# Patient Record
Sex: Male | Born: 1940 | Race: White | Hispanic: No | Smoking: Former smoker
Health system: Southern US, Community
[De-identification: ages and names within clinical notes are randomized; demographics above are authoritative.]

## PROBLEM LIST (undated history)

## (undated) DIAGNOSIS — N200 Calculus of kidney: Secondary | ICD-10-CM

## (undated) DIAGNOSIS — M7021 Olecranon bursitis, right elbow: Secondary | ICD-10-CM

## (undated) DIAGNOSIS — K219 Gastro-esophageal reflux disease without esophagitis: Secondary | ICD-10-CM

## (undated) DIAGNOSIS — N433 Hydrocele, unspecified: Secondary | ICD-10-CM

## (undated) DIAGNOSIS — E785 Hyperlipidemia, unspecified: Secondary | ICD-10-CM

## (undated) DIAGNOSIS — M069 Rheumatoid arthritis, unspecified: Secondary | ICD-10-CM

## (undated) DIAGNOSIS — I639 Cerebral infarction, unspecified: Secondary | ICD-10-CM

## (undated) DIAGNOSIS — R7301 Impaired fasting glucose: Secondary | ICD-10-CM

## (undated) DIAGNOSIS — Z7901 Long term (current) use of anticoagulants: Secondary | ICD-10-CM

## (undated) DIAGNOSIS — G8929 Other chronic pain: Secondary | ICD-10-CM

## (undated) DIAGNOSIS — I10 Essential (primary) hypertension: Secondary | ICD-10-CM

## (undated) DIAGNOSIS — R001 Bradycardia, unspecified: Secondary | ICD-10-CM

## (undated) DIAGNOSIS — I429 Cardiomyopathy, unspecified: Secondary | ICD-10-CM

## (undated) DIAGNOSIS — I25119 Atherosclerotic heart disease of native coronary artery with unspecified angina pectoris: Secondary | ICD-10-CM

## (undated) DIAGNOSIS — R55 Syncope and collapse: Secondary | ICD-10-CM

## (undated) DIAGNOSIS — M549 Dorsalgia, unspecified: Secondary | ICD-10-CM

## (undated) DIAGNOSIS — J189 Pneumonia, unspecified organism: Secondary | ICD-10-CM

## (undated) DIAGNOSIS — K257 Chronic gastric ulcer without hemorrhage or perforation: Secondary | ICD-10-CM

## (undated) DIAGNOSIS — M25511 Pain in right shoulder: Secondary | ICD-10-CM

## (undated) DIAGNOSIS — I480 Paroxysmal atrial fibrillation: Secondary | ICD-10-CM

## (undated) DIAGNOSIS — M25512 Pain in left shoulder: Secondary | ICD-10-CM

## (undated) DIAGNOSIS — M79604 Pain in right leg: Secondary | ICD-10-CM

## (undated) HISTORY — DX: Chronic gastric ulcer without hemorrhage or perforation: K25.7

## (undated) HISTORY — DX: Pain in right leg: M79.604

## (undated) HISTORY — DX: Atherosclerotic heart disease of native coronary artery with unspecified angina pectoris: I25.119

## (undated) HISTORY — DX: Essential (primary) hypertension: I10

## (undated) HISTORY — DX: Other chronic pain: G89.29

## (undated) HISTORY — DX: Long term (current) use of anticoagulants: Z79.01

## (undated) HISTORY — PX: CORONARY ANGIOPLASTY WITH STENT PLACEMENT: SHX49

## (undated) HISTORY — DX: Hyperlipidemia, unspecified: E78.5

## (undated) HISTORY — DX: Impaired fasting glucose: R73.01

## (undated) HISTORY — DX: Pneumonia, unspecified organism: J18.9

## (undated) HISTORY — DX: Olecranon bursitis, right elbow: M70.21

## (undated) HISTORY — DX: Dorsalgia, unspecified: M54.9

## (undated) HISTORY — DX: Bradycardia, unspecified: R00.1

## (undated) HISTORY — DX: Pain in left shoulder: M25.512

## (undated) HISTORY — DX: Cardiomyopathy, unspecified: I42.9

## (undated) HISTORY — DX: Hydrocele, unspecified: N43.3

## (undated) HISTORY — DX: Gastro-esophageal reflux disease without esophagitis: K21.9

## (undated) HISTORY — DX: Cerebral infarction, unspecified: I63.9

## (undated) HISTORY — DX: Paroxysmal atrial fibrillation: I48.0

## (undated) HISTORY — DX: Calculus of kidney: N20.0

## (undated) HISTORY — DX: Syncope and collapse: R55

## (undated) HISTORY — PX: TOTAL HIP REVISION: SHX763

## (undated) HISTORY — DX: Rheumatoid arthritis, unspecified: M06.9

## (undated) HISTORY — DX: Pain in right shoulder: M25.511

---

## 2005-12-22 ENCOUNTER — Encounter: Admission: RE | Admit: 2005-12-22 | Discharge: 2005-12-22 | Payer: Self-pay | Admitting: Geriatric Medicine

## 2006-01-31 HISTORY — PX: CORONARY ARTERY BYPASS GRAFT: SHX141

## 2006-07-16 ENCOUNTER — Encounter: Admission: RE | Admit: 2006-07-16 | Discharge: 2006-07-16 | Payer: Self-pay | Admitting: Family Medicine

## 2006-07-30 ENCOUNTER — Encounter: Admission: RE | Admit: 2006-07-30 | Discharge: 2006-07-30 | Payer: Self-pay | Admitting: Family Medicine

## 2006-08-20 ENCOUNTER — Encounter: Admission: RE | Admit: 2006-08-20 | Discharge: 2006-08-20 | Payer: Self-pay | Admitting: Family Medicine

## 2012-06-10 ENCOUNTER — Telehealth: Payer: Self-pay | Admitting: *Deleted

## 2012-06-10 NOTE — Telephone Encounter (Signed)
Call and check on pt tomorrow.

## 2012-06-11 NOTE — Telephone Encounter (Signed)
Wrong pt

## 2015-03-28 DIAGNOSIS — E785 Hyperlipidemia, unspecified: Secondary | ICD-10-CM

## 2015-03-28 DIAGNOSIS — I482 Chronic atrial fibrillation, unspecified: Secondary | ICD-10-CM | POA: Insufficient documentation

## 2015-03-28 DIAGNOSIS — I25119 Atherosclerotic heart disease of native coronary artery with unspecified angina pectoris: Secondary | ICD-10-CM

## 2015-03-28 DIAGNOSIS — I480 Paroxysmal atrial fibrillation: Secondary | ICD-10-CM

## 2015-03-28 DIAGNOSIS — I119 Hypertensive heart disease without heart failure: Secondary | ICD-10-CM | POA: Insufficient documentation

## 2015-03-28 DIAGNOSIS — I10 Essential (primary) hypertension: Secondary | ICD-10-CM

## 2015-03-28 DIAGNOSIS — Z7901 Long term (current) use of anticoagulants: Secondary | ICD-10-CM

## 2015-03-28 HISTORY — DX: Essential (primary) hypertension: I10

## 2015-03-28 HISTORY — DX: Paroxysmal atrial fibrillation: I48.0

## 2015-03-28 HISTORY — DX: Long term (current) use of anticoagulants: Z79.01

## 2015-03-28 HISTORY — DX: Hyperlipidemia, unspecified: E78.5

## 2015-03-28 HISTORY — DX: Atherosclerotic heart disease of native coronary artery with unspecified angina pectoris: I25.119

## 2015-05-08 DIAGNOSIS — I639 Cerebral infarction, unspecified: Secondary | ICD-10-CM

## 2015-05-08 HISTORY — DX: Cerebral infarction, unspecified: I63.9

## 2016-02-27 DIAGNOSIS — J189 Pneumonia, unspecified organism: Secondary | ICD-10-CM

## 2016-02-27 DIAGNOSIS — R55 Syncope and collapse: Secondary | ICD-10-CM

## 2016-02-27 DIAGNOSIS — M549 Dorsalgia, unspecified: Secondary | ICD-10-CM

## 2016-02-27 HISTORY — DX: Syncope and collapse: R55

## 2016-02-27 HISTORY — DX: Pneumonia, unspecified organism: J18.9

## 2016-02-27 HISTORY — DX: Dorsalgia, unspecified: M54.9

## 2016-04-09 DIAGNOSIS — M25512 Pain in left shoulder: Secondary | ICD-10-CM

## 2016-04-09 DIAGNOSIS — G8929 Other chronic pain: Secondary | ICD-10-CM | POA: Insufficient documentation

## 2016-04-09 DIAGNOSIS — M25511 Pain in right shoulder: Secondary | ICD-10-CM

## 2016-04-09 HISTORY — DX: Other chronic pain: G89.29

## 2017-04-07 DIAGNOSIS — M79604 Pain in right leg: Secondary | ICD-10-CM

## 2017-04-07 DIAGNOSIS — N433 Hydrocele, unspecified: Secondary | ICD-10-CM

## 2017-04-07 HISTORY — DX: Pain in right leg: M79.604

## 2017-04-07 HISTORY — DX: Hydrocele, unspecified: N43.3

## 2017-04-10 DIAGNOSIS — M7021 Olecranon bursitis, right elbow: Secondary | ICD-10-CM

## 2017-04-10 HISTORY — DX: Olecranon bursitis, right elbow: M70.21

## 2017-08-28 DIAGNOSIS — K257 Chronic gastric ulcer without hemorrhage or perforation: Secondary | ICD-10-CM | POA: Insufficient documentation

## 2017-08-28 DIAGNOSIS — R7301 Impaired fasting glucose: Secondary | ICD-10-CM

## 2017-08-28 DIAGNOSIS — I429 Cardiomyopathy, unspecified: Secondary | ICD-10-CM

## 2017-08-28 DIAGNOSIS — M069 Rheumatoid arthritis, unspecified: Secondary | ICD-10-CM

## 2017-08-28 DIAGNOSIS — R001 Bradycardia, unspecified: Secondary | ICD-10-CM

## 2017-08-28 DIAGNOSIS — N200 Calculus of kidney: Secondary | ICD-10-CM

## 2017-08-28 DIAGNOSIS — K219 Gastro-esophageal reflux disease without esophagitis: Secondary | ICD-10-CM

## 2017-08-28 HISTORY — DX: Cardiomyopathy, unspecified: I42.9

## 2017-08-28 HISTORY — DX: Gastro-esophageal reflux disease without esophagitis: K21.9

## 2017-08-28 HISTORY — DX: Chronic gastric ulcer without hemorrhage or perforation: K25.7

## 2017-08-28 HISTORY — DX: Calculus of kidney: N20.0

## 2017-08-28 HISTORY — DX: Rheumatoid arthritis, unspecified: M06.9

## 2017-08-28 HISTORY — DX: Impaired fasting glucose: R73.01

## 2017-08-28 HISTORY — DX: Bradycardia, unspecified: R00.1

## 2017-09-03 ENCOUNTER — Ambulatory Visit (INDEPENDENT_AMBULATORY_CARE_PROVIDER_SITE_OTHER): Payer: Medicare Other | Admitting: Cardiology

## 2017-09-03 ENCOUNTER — Encounter: Payer: Self-pay | Admitting: Cardiology

## 2017-09-03 VITALS — BP 138/84 | HR 70 | Ht 74.0 in | Wt 204.0 lb

## 2017-09-03 DIAGNOSIS — E785 Hyperlipidemia, unspecified: Secondary | ICD-10-CM

## 2017-09-03 DIAGNOSIS — Z789 Other specified health status: Secondary | ICD-10-CM

## 2017-09-03 DIAGNOSIS — I10 Essential (primary) hypertension: Secondary | ICD-10-CM | POA: Diagnosis not present

## 2017-09-03 DIAGNOSIS — I480 Paroxysmal atrial fibrillation: Secondary | ICD-10-CM

## 2017-09-03 DIAGNOSIS — Z7901 Long term (current) use of anticoagulants: Secondary | ICD-10-CM | POA: Diagnosis not present

## 2017-09-03 DIAGNOSIS — I25119 Atherosclerotic heart disease of native coronary artery with unspecified angina pectoris: Secondary | ICD-10-CM | POA: Diagnosis not present

## 2017-09-03 DIAGNOSIS — R079 Chest pain, unspecified: Secondary | ICD-10-CM | POA: Diagnosis not present

## 2017-09-03 MED ORDER — NITROGLYCERIN 0.4 MG SL SUBL: 0.4000 mg | SUBLINGUAL_TABLET | SUBLINGUAL | 12 refills | Status: AC | PRN

## 2017-09-03 NOTE — Progress Notes (Signed)
Cardiology Office Note:    Date:  09/03/2017   ID:  Tristan Tate, DOB [birth year], MRN [id number]  PCP:  Leanora Ivanoff., MD  Cardiologist:  Norman Herrlich, MD    Referring MD: Dellinger, Romelle Starcher*    ASSESSMENT:    1. Coronary artery disease involving native coronary artery of native heart with angina pectoris (HCC)   2. Chronic atrial fibrillation (HCC)   3. Long term current use of anticoagulant   4. Essential hypertension   5. Hyperlipidemia, unspecified hyperlipidemia type   6. Statin intolerance    PLAN:    In order of problems listed above:  1. Worsened he is having symptoms that sound like limiting angina with the progression and will undergo myocardial perfusion study if abnormal will make a decision between intensified medical therapy or referral for PCI depending on high risk markers.  I did give him a prescription for nitroglycerin and asked him to continue current treatment 2. Paroxysmal atrial fibrillation stable continue warfarin 3. Stable continue warfarin goal INR is 2-2.5 4. Controlled continue current treatment 5. Poorly controlled intolerant of statins and unwilling to use PCSK9   Next appointment: One month   Medication Adjustments/Labs and Tests Ordered: Current medicines are reviewed at length with the patient today.  Concerns regarding medicines are outlined above.  Orders Placed This Encounter  Procedures  . MYOCARDIAL PERFUSION IMAGING  . EKG 12-Lead   Meds ordered this encounter  Medications  . nitroGLYCERIN (NITROSTAT) 0.4 MG SL tablet    Sig: Place 1 tablet (0.4 mg total) under the tongue every 5 (five) minutes as needed for chest pain.    Dispense:  25 tablet    Refill:  12    Chief Complaint  Patient presents with  . Follow-up    1 year follow up appt   . Chest Pain    I need a stress test  . Coronary Artery Disease  . Hypertension  . Atrial Fibrillation  . Anticoagulation    History of Present Illness:    Tristan Tate is a 77 y.o. male with a hx of CAD, Paroxysmal Atrial Fibrillation, Dyslipidemia with statin intolerance, S/P CABG in 2007, mild cardiomyopathy Ejection Fraction = 45-50% 02/27/16  last seen 08/11/16.  Diagnoses and all orders for 08/11/16  Coronary artery disease involving native coronary artery of native heart with angina pectoris (RAF-HCC) stable continue current medical treatment with warfarin beta blocker and start lipid-lowering therapy  Cardiomyopathy, secondary (RAF-HCC) stable mild no evidence of heart failure  Syncope, unspecified syncope type clinically appeared to be in the setting of pneumonia and macrolide antibiotic, with his CAD is at risk for both ventricular tachyarrhythmia as well as symptomatic bradycardia and I asked him where Holter monitor for 48 hours. - Holter monitor - 48 hour; Future  Hyperlipidemia, unspecified hyperlipidemia type (RAF-HCC) poorly controlled statin and Zetia might intolerant to start treatment and follow-up lipid profile in 6 weeks - alirocumab (PRALUENT PEN) 150 mg/mL subcutaneous injection; Inject 1 mL (150 mg total) under the skin every fourteen (14) days. - Lipid Panel; Future  Life line screen with normal ABI, mild L ICA stenosis, 145/36 cm/ sec  Compliance with diet, lifestyle and medications: Yes  We tried to treat his dyslipidemia PCSK9 but he stopped.  He said that recently he has had trouble doing physical activity walking outdoors playing golf because he is having typical exertional angina it interferes with his life.  He has not taken nitroglycerin.  He asked  me if I thought he needed a stress test I told him that was an option I thought that he should consider a coronary angiogram with PCI and stenting but he wants to start with having a stress test first and will set up a stress myocardial perfusion study to assess his symptoms degree of ischemia and guide whether we should consider repeat revascularization with PCI.  He has no  complaints of shortness of breath palpitation or syncope.  He had a [facility] screening and that day he was in atrial fibrillation he is in sinus rhythm today and continues with warfarin for stroke prophylaxis managed by his PCP.  He has had no bleeding complication. Past Medical History:  Diagnosis Date  . Back pain 02/27/2016  . Chronic gastric ulcer 08/28/2017  . Chronic pain of both shoulders 04/09/2016  . Coronary artery disease involving native coronary artery of native heart with angina pectoris (HCC) 03/28/2015  . Esophageal reflux 08/28/2017  . Essential hypertension 03/28/2015  . Hydrocele, right 04/07/2017  . Hyperlipemia 03/28/2015   Statin intolerant Statin intolerant  . Impaired fasting glucose 08/28/2017  . Long term current use of anticoagulant 03/28/2015  . Nephrolithiasis 08/28/2017  . Olecranon bursitis of right elbow 04/10/2017  . PAF (paroxysmal atrial fibrillation) (HCC) 03/28/2015   CHADS2 vasc score= 3  . Pneumonia 02/27/2016  . Rheumatoid arthritis (HCC) 08/28/2017  . Right leg pain 04/07/2017  . 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 Stroke (HCC) 05/08/2015  . Syncope 02/27/2016    Past Surgical History:  Procedure Laterality Date  . CORONARY ANGIOPLASTY WITH STENT PLACEMENT    . CORONARY ARTERY BYPASS GRAFT  01/2006   LTA to LAD svg's to D3, M3, Ramus and RCA   . TOTAL HIP REVISION      Current Medications: Current Meds  Medication Sig  . acetaminophen (TYLENOL 8 HOUR ARTHRITIS PAIN) 650 MG CR tablet Take 650 mg by mouth every 8 (eight) hours as needed for pain.  Marland Kitchen DOCOSAHEXAENOIC ACID PO Take 1 g by mouth 2 (two) times daily.  . metoprolol tartrate (LOPRESSOR) 50 MG tablet TK 1/2 T PO BID  . vitamin B-12 (CYANOCOBALAMIN) 1000 MCG tablet Take 1,000 mcg by mouth daily.  Marland Kitchen warfarin (COUMADIN) 4 MG tablet TAKE 1 TABLET BY MOUTH DAILY OR AS DIRECTED BASED ON INR     Allergies:   Aspirin and Nsaids   Social History    Socioeconomic History  . Marital status: Unknown    Spouse name: Not on file  . Number of children: Not on file  . Years of education: Not on file  . Highest education level: Not on file  Occupational History  . Not on file  Social Needs  . Financial resource strain: Not on file  . Food insecurity:    Worry: Not on file    Inability: Not on file  . Transportation needs:    Medical: Not on file    Non-medical: Not on file  Tobacco Use  . Smoking status: Former Smoker    Last attempt to quit: 03/29/1975    Years since quitting: 42.4  . Smokeless tobacco: Never Used  Substance and Sexual Activity  . Alcohol use: Never    Frequency: Never  . Drug use: Never  . Sexual activity: Not on file  Lifestyle  . Physical activity:    Days per week: Not on file    Minutes per session: Not on file  . Stress: Not on  file  Relationships  . Social connections:    Talks on phone: Not on file    Gets together: Not on file    Attends religious service: Not on file    Active member of club or organization: Not on file    Attends meetings of clubs or organizations: Not on file    Relationship status: Not on file  Other Topics Concern  . Not on file  Social History Narrative  . Not on file     Family History: The patient's family history includes Colon cancer in his brother. ROS:   Please see the history of present illness.    All other systems reviewed and are negative.  EKGs/Labs/Other Studies Reviewed:    The following studies were reviewed today:  EKG:  EKG  6 lead 08/12/17 Life Line AF controlled rate EKG todaySRTH old inf MI Recent Labs:   08/10/17 INR 3.0 No results found for requested labs within last 8760 hours.  Recent Lipid Panel  12/22/16 Chol 191, HDL 38 LDL 128 No results found for: CHOL, TRIG, HDL, CHOLHDL, VLDL, LDLCALC, LDLDIRECT  Physical Exam:    VS:  BP 138/84 (BP Location: Right Arm, Patient Position: Sitting, Cuff Size: Normal)   Pulse 70   Ht 6\' 2"   (1.88 m)   Wt 204 lb (92.5 kg)   SpO2 98%   BMI 26.19 kg/m     Wt Readings from Last 3 Encounters:  09/03/17 204 lb (92.5 kg)     GEN:  Well nourished, well developed in no acute distress HEENT: Normal NECK: No JVD; No carotid bruits LYMPHATICS: No lymphadenopathy CARDIAC: Irr Irr variable s1  RESPIRATORY:  Clear to auscultation without rales, wheezing or rhonchi  ABDOMEN: Soft, non-tender, non-distended MUSCULOSKELETAL:  No edema; No deformity  SKIN: Warm and dry NEUROLOGIC:  Alert and oriented x 3 PSYCHIATRIC:  Normal affect    Signed, Norman Herrlich, MD  09/03/2017 3:57 PM    [facility]

## 2017-09-03 NOTE — Patient Instructions (Addendum)
Medication Instructions:  Your physician has recommended you make the following change in your medication:  START nitroglycerin 0.4 mg sublingual (under your tongue) every 5 minutes as needed for chest pain. When having chest pain, stop what you are doing and sit down. Take 1 nitro, wait 5 minutes. Still having chest pain, take 1 nitro, wait 5 minutes. Still having chest pain, take 1 nitro, dial 911. Total of 3 nitro in 15 minutes.  Labwork: None  Testing/Procedures: You had an EKG today.  Your physician has requested that you have en exercise stress myoview. For further information please visit [email or web address] Please follow instruction sheet, as given.  Follow-Up: Your physician recommends that you schedule a follow-up appointment in: 4 weeks.  Any Other Special Instructions Will Be Listed Below (If Applicable).     If you need a refill on your cardiac medications before your next appointment, please call your pharmacy.

## 2017-09-07 ENCOUNTER — Telehealth (HOSPITAL_COMMUNITY): Payer: Self-pay | Admitting: *Deleted

## 2017-09-07 NOTE — Telephone Encounter (Signed)
Patient's wife, dpr, given detailed instructions per Myocardial Perfusion Study Information Sheet for the test on 09/09/17. Patient notified to arrive 15 minutes early and that it is imperative to arrive on time for appointment to keep from having the test rescheduled.  If you need to cancel or reschedule your appointment, please call the office within 24 hours of your appointment. . Patient verbalized understanding. Ricky Ala

## 2017-09-09 ENCOUNTER — Ambulatory Visit (HOSPITAL_COMMUNITY): Payer: Medicare Other | Attending: Cardiovascular Disease

## 2017-09-09 VITALS — Ht 74.0 in | Wt 204.0 lb

## 2017-09-09 DIAGNOSIS — I25119 Atherosclerotic heart disease of native coronary artery with unspecified angina pectoris: Secondary | ICD-10-CM | POA: Diagnosis present

## 2017-09-09 DIAGNOSIS — R55 Syncope and collapse: Secondary | ICD-10-CM

## 2017-09-09 DIAGNOSIS — I482 Chronic atrial fibrillation, unspecified: Secondary | ICD-10-CM

## 2017-09-09 LAB — MYOCARDIAL PERFUSION IMAGING
CHL CUP NUCLEAR SRS: 7
CHL CUP RESTING HR STRESS: 67 {beats}/min
CSEPPHR: 80 {beats}/min
LV dias vol: 127 mL (ref 62–150)
LV sys vol: 58 mL
RATE: 0.3
SDS: 5
SSS: 12
TID: 0.97

## 2017-09-09 MED ORDER — REGADENOSON 0.4 MG/5ML IV SOLN
0.4000 mg | Freq: Once | INTRAVENOUS | Status: AC
  Administered 2017-09-09: 0.4 mg via INTRAVENOUS

## 2017-09-09 MED ORDER — TECHNETIUM TC 99M TETROFOSMIN IV KIT
32.7000 | PACK | Freq: Once | INTRAVENOUS | Status: AC | PRN
  Administered 2017-09-09: 32.7 via INTRAVENOUS
  Filled 2017-09-09: qty 33

## 2017-09-09 MED ORDER — TECHNETIUM TC 99M TETROFOSMIN IV KIT
10.5000 | PACK | Freq: Once | INTRAVENOUS | Status: AC | PRN
  Administered 2017-09-09: 10.5 via INTRAVENOUS
  Filled 2017-09-09: qty 11

## 2017-10-12 NOTE — Progress Notes (Signed)
Cardiology Office Note:    Date:  10/13/2017   ID:  Tristan Tate, DOB 1941/02/14, MRN [id number]  PCP:  Leanora Ivanoff., MD  Cardiologist:  Norman Herrlich, MD    Referring MD: Dellinger, Romelle Starcher*    ASSESSMENT:    1. Coronary artery disease involving native coronary artery of native heart with angina pectoris (HCC)   2. Chronic atrial fibrillation (HCC)   3. Hypertensive heart disease without heart failure   4. Long term current use of anticoagulant    PLAN:    In order of problems listed above:  1. Stable continue current medical treatment at this time I would not refer for coronary angiography.  I did tell him that he can use nitroglycerin prior to strenuous activity and cold weather such as golf. 2. Stable rate controlled continue beta-blocker and warfarin managed by his PCP 3. Stable continue current treatment 4. Continue warfarin goal INR 2.5 managed by his PCP   Next appointment: 6 months   Medication Adjustments/Labs and Tests Ordered: Current medicines are reviewed at length with the patient today.  Concerns regarding medicines are outlined above.  No orders of the defined types were placed in this encounter.  No orders of the defined types were placed in this encounter.   Chief Complaint  Patient presents with  . Follow-up    after myoview stress test   . Coronary Artery Disease    History of Present Illness:    Tristan Tate is a 77 y.o. male with a hx of CAD, Paroxysmal Atrial Fibrillation, Dyslipidemia with statin intolerance, S/P CABG in 2007, mild cardiomyopathy Ejection Fraction = 45-50% last seen 09/03/17.  ASSESSMENT:    09/03/17   1. Coronary artery disease involving native coronary artery of native heart with angina pectoris (HCC)   2. Chronic atrial fibrillation (HCC)   3. Long term current use of anticoagulant   4. Essential hypertension   5. Hyperlipidemia, unspecified hyperlipidemia type   6. Statin intolerance    PLAN:      In order of problems listed above:  1.   Worsened he is having symptoms that sound like limiting angina with the progression and will undergo myocardial perfusion study if abnormal will make a decision between intensified medical therapy or referral for PCI depending on high risk markers.  I did give him a prescription for nitroglycerin and asked him to continue current treatment 5. Paroxysmal atrial fibrillation stable continue warfarin 6. Stable continue warfarin goal INR is 2-2.5 7. Controlled continue current treatment 8. Poorly controlled intolerant of statins and unwilling to use PCSK9  Compliance with diet, lifestyle and medications: Yes  He is relieved by the results of his stress myocardial perfusion study and has had no further anginal discomfort golfing and warmer weather.  I did give him a prescription for nitroglycerin and told him that he can take a prior to golf and cold weather.  Continues on warfarin without bleeding complication or TIA no chest pain shortness of breath or palpitation Past Medical History:  Diagnosis Date  . Back pain 02/27/2016  . Chronic gastric ulcer 08/28/2017  . Chronic pain of both shoulders 04/09/2016  . Coronary artery disease involving native coronary artery of native heart with angina pectoris (HCC) 03/28/2015  . Esophageal reflux 08/28/2017  . Essential hypertension 03/28/2015  . Hydrocele, right 04/07/2017  . Hyperlipemia 03/28/2015   Statin intolerant Statin intolerant  . Impaired fasting glucose 08/28/2017  . Long term current use of anticoagulant 03/28/2015  .  Nephrolithiasis 08/28/2017  . Olecranon bursitis of right elbow 04/10/2017  . PAF (paroxysmal atrial fibrillation) (HCC) 03/28/2015   CHADS2 vasc score= 3  . Pneumonia 02/27/2016  . Rheumatoid arthritis (HCC) 08/28/2017  . Right leg pain 04/07/2017  . 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 Stroke (HCC) 05/08/2015  . Syncope 02/27/2016    Past  Surgical History:  Procedure Laterality Date  . CORONARY ANGIOPLASTY WITH STENT PLACEMENT    . CORONARY ARTERY BYPASS GRAFT  01/2006   LTA to LAD svg's to D3, M3, Ramus and RCA   . TOTAL HIP REVISION      Current Medications: Current Meds  Medication Sig  . acetaminophen (TYLENOL 8 HOUR ARTHRITIS PAIN) 650 MG CR tablet Take 650 mg by mouth every 8 (eight) hours as needed for pain.  Marland Kitchen DOCOSAHEXAENOIC ACID PO Take 1 g by mouth 2 (two) times daily.  . metoprolol tartrate (LOPRESSOR) 50 MG tablet TK 1/2 T PO BID  . nitroGLYCERIN (NITROSTAT) 0.4 MG SL tablet Place 1 tablet (0.4 mg total) under the tongue every 5 (five) minutes as needed for chest pain.  . vitamin B-12 (CYANOCOBALAMIN) 1000 MCG tablet Take 1,000 mcg by mouth daily.  Marland Kitchen warfarin (COUMADIN) 4 MG tablet TAKE 1 TABLET BY MOUTH DAILY OR AS DIRECTED BASED ON INR     Allergies:   Aspirin and Nsaids   Social History   Socioeconomic History  . Marital status: Unknown    Spouse name: Not on file  . Number of children: Not on file  . Years of education: Not on file  . Highest education level: Not on file  Occupational History  . Not on file  Social Needs  . Financial resource strain: Not on file  . Food insecurity:    Worry: Not on file    Inability: Not on file  . Transportation needs:    Medical: Not on file    Non-medical: Not on file  Tobacco Use  . Smoking status: Former Smoker    Last attempt to quit: 03/29/1975    Years since quitting: 42.5  . Smokeless tobacco: Never Used  Substance and Sexual Activity  . Alcohol use: Never    Frequency: Never  . Drug use: Never  . Sexual activity: Not on file  Lifestyle  . Physical activity:    Days per week: Not on file    Minutes per session: Not on file  . Stress: Not on file  Relationships  . Social connections:    Talks on phone: Not on file    Gets together: Not on file    Attends religious service: Not on file    Active member of club or organization: Not on  file    Attends meetings of clubs or organizations: Not on file    Relationship status: Not on file  Other Topics Concern  . Not on file  Social History Narrative  . Not on file     Family History: The patient's family history includes Colon cancer in his brother. ROS:   Please see the history of present illness.    All other systems reviewed and are negative.  EKGs/Labs/Other Studies Reviewed:    The following studies were reviewed today:  MPI: Study Highlights   Nuclear stress EF: 54%.  There was no ST segment deviation noted during stress.  Defect 1: There is a small defect of moderate severity present in the basal inferior and mid inferior location. Findings consistent  with either diaphragmatic attenuation or prior infarct. No ischemia noted.  The left ventricular ejection fraction is mildly decreased (45-54%).  This is a low risk study.   Recent Labs: No results found for requested labs within last 8760 hours.  Recent Lipid Panel No results found for: CHOL, TRIG, HDL, CHOLHDL, VLDL, LDLCALC, LDLDIRECT  Physical Exam:    VS:  BP 126/68 (BP Location: Right Arm, Patient Position: Sitting, Cuff Size: Large)   Pulse 80   Ht 6\' 2"  (1.88 m)   Wt 202 lb 12.8 oz (92 kg)   SpO2 98%   BMI 26.04 kg/m     Wt Readings from Last 3 Encounters:  10/13/17 202 lb 12.8 oz (92 kg)  09/09/17 204 lb (92.5 kg)  09/03/17 204 lb (92.5 kg)     GEN:  Well nourished, well developed in no acute distress HEENT: Normal NECK: No JVD; No carotid bruits LYMPHATICS: No lymphadenopathy CARDIAC: RRR, no murmurs, rubs, gallops RESPIRATORY:  Clear to auscultation without rales, wheezing or rhonchi  ABDOMEN: Soft, non-tender, non-distended MUSCULOSKELETAL:  No edema; No deformity  SKIN: Warm and dry NEUROLOGIC:  Alert and oriented x 3 PSYCHIATRIC:  Normal affect    Signed, Norman Herrlich, MD  10/13/2017 12:21 PM    [facility]

## 2017-10-13 ENCOUNTER — Encounter: Payer: Self-pay | Admitting: Cardiology

## 2017-10-13 ENCOUNTER — Ambulatory Visit (INDEPENDENT_AMBULATORY_CARE_PROVIDER_SITE_OTHER): Payer: Medicare Other | Admitting: Cardiology

## 2017-10-13 VITALS — BP 126/68 | HR 80 | Ht 74.0 in | Wt 202.8 lb

## 2017-10-13 DIAGNOSIS — Z7901 Long term (current) use of anticoagulants: Secondary | ICD-10-CM | POA: Diagnosis not present

## 2017-10-13 DIAGNOSIS — I119 Hypertensive heart disease without heart failure: Secondary | ICD-10-CM | POA: Diagnosis not present

## 2017-10-13 DIAGNOSIS — I25119 Atherosclerotic heart disease of native coronary artery with unspecified angina pectoris: Secondary | ICD-10-CM

## 2017-10-13 DIAGNOSIS — I482 Chronic atrial fibrillation, unspecified: Secondary | ICD-10-CM

## 2017-10-13 NOTE — Patient Instructions (Signed)

## 2018-02-04 ENCOUNTER — Telehealth: Payer: Self-pay | Admitting: Cardiology

## 2018-02-04 MED ORDER — METOPROLOL TARTRATE 50 MG PO TABS: ORAL_TABLET | ORAL | 1 refills | Status: DC

## 2018-02-04 NOTE — Telephone Encounter (Signed)
°  Patient is out of medication   1. Which medications need to be refilled? (please list name of each medication and dose if known) BP medicine  2. Which pharmacy/location (including street and city if local pharmacy) is medication to be sent to?[facility] [address] [place]  3. Do they need a 30 day or 90 day supply? 90 day supply

## 2018-02-04 NOTE — Telephone Encounter (Signed)
Refill for metoprolol tartrate sent to [facility] in [place] after confirming patient takes 0.5 tablet (25 mg) twice daily.

## 2018-02-04 NOTE — Addendum Note (Signed)
Addended by: Crist Fat on: 02/04/2018 05:11 PM   Modules accepted: Orders

## 2018-04-20 NOTE — Progress Notes (Signed)
Cardiology Office Note:    Date:  04/21/2018   ID:  Tristan Tate, DOB 03-16-1941, MRN [id number]  PCP:  Leanora Ivanoff., MD  Cardiologist:  Norman Herrlich, MD    Referring MD: Dellinger, Romelle Starcher*    ASSESSMENT:    1. Coronary artery disease involving native coronary artery of native heart with angina pectoris (HCC)   2. Chronic atrial fibrillation   3. Hypertensive heart disease without heart failure   4. Long term current use of anticoagulant   5. Hyperlipidemia, unspecified hyperlipidemia type    PLAN:    In order of problems listed above:  1. Stable pattern, in cold weather he has mild exertional angina when he golfs offered an additional antianginal medications or to take nitroglycerin prior to activity and he declines.  Is generally stops rest for a moment is able to walk through.  He will continue his other current medical treatment and again declines lipid-lowering therapy. 2. Stable and controlled he will continue beta-blocker warfarin managed with his PCP 3. Stable continue beta-blocker 4. Continue warfarin: INR 2.5 5. Poorly controlled he declines lipid-lowering therapy   Next appointment: 6 months   Medication Adjustments/Labs and Tests Ordered: Current medicines are reviewed at length with the patient today.  Concerns regarding medicines are outlined above.  Orders Placed This Encounter  Procedures  . EKG 12-Lead   No orders of the defined types were placed in this encounter.   Chief Complaint  Patient presents with  . Coronary Artery Disease    History of Present Illness:    Tristan Tate is a 77 y.o. 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  His myocardial perfusion study performed 09/09/2017 shows ejection fraction 54% fixed defect was present there was no ischemia. Compliance with  diet, lifestyle and medications: Yes  Overall is done well he has a stable pattern of exertional angina in the morning coughing cold weather.  No rest or nocturnal chest pain shortness of breath palpitation or syncope.  Tolerates his anticoagulant without bleeding complication managed with his primary care physician Past Medical History:  Diagnosis Date  . Back pain 02/27/2016  . Chronic gastric ulcer 08/28/2017  . Chronic pain of both shoulders 04/09/2016  . Coronary artery disease involving native coronary artery of native heart with angina pectoris (HCC) 03/28/2015  . Esophageal reflux 08/28/2017  . Essential hypertension 03/28/2015  . Hydrocele, right 04/07/2017  . Hyperlipemia 03/28/2015   Statin intolerant Statin intolerant  . Impaired fasting glucose 08/28/2017  . Long term current use of anticoagulant 03/28/2015  . Nephrolithiasis 08/28/2017  . Olecranon bursitis of right elbow 04/10/2017  . PAF (paroxysmal atrial fibrillation) (HCC) 03/28/2015   CHADS2 vasc score= 3  . Pneumonia 02/27/2016  . Rheumatoid arthritis (HCC) 08/28/2017  . Right leg pain 04/07/2017  . 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 Stroke (HCC) 05/08/2015  . Syncope 02/27/2016    Past Surgical History:  Procedure Laterality Date  . CORONARY ANGIOPLASTY WITH STENT PLACEMENT    . CORONARY ARTERY BYPASS GRAFT  01/2006   LTA to LAD svg's to D3, M3, Ramus and RCA   . TOTAL HIP REVISION      Current Medications: Current Meds  Medication Sig  . acetaminophen (TYLENOL 8 HOUR ARTHRITIS PAIN) 650 MG CR tablet Take 650 mg by mouth every 8 (eight)  hours as needed for pain.  Marland Kitchen DOCOSAHEXAENOIC ACID PO Take 1 g by mouth 2 (two) times daily.  . metoprolol tartrate (LOPRESSOR) 50 MG tablet TK 1/2 T PO BID  . nitroGLYCERIN (NITROSTAT) 0.4 MG SL tablet Place 1 tablet (0.4 mg total) under the tongue every 5 (five) minutes as needed for chest pain.  . vitamin B-12 (CYANOCOBALAMIN) 1000 MCG  tablet Take 1,000 mcg by mouth daily.  Marland Kitchen warfarin (COUMADIN) 4 MG tablet TAKE 1 TABLET BY MOUTH DAILY OR AS DIRECTED BASED ON INR     Allergies:   Aspirin and Nsaids   Social History   Socioeconomic History  . Marital status: Unknown    Spouse name: Not on file  . Number of children: Not on file  . Years of education: Not on file  . Highest education level: Not on file  Occupational History  . Not on file  Social Needs  . Financial resource strain: Not on file  . Food insecurity:    Worry: Not on file    Inability: Not on file  . Transportation needs:    Medical: Not on file    Non-medical: Not on file  Tobacco Use  . Smoking status: Former Smoker    Last attempt to quit: 03/29/1975    Years since quitting: 43.0  . Smokeless tobacco: Never Used  Substance and Sexual Activity  . Alcohol use: Never    Frequency: Never  . Drug use: Never  . Sexual activity: Not on file  Lifestyle  . Physical activity:    Days per week: Not on file    Minutes per session: Not on file  . Stress: Not on file  Relationships  . Social connections:    Talks on phone: Not on file    Gets together: Not on file    Attends religious service: Not on file    Active member of club or organization: Not on file    Attends meetings of clubs or organizations: Not on file    Relationship status: Not on file  Other Topics Concern  . Not on file  Social History Narrative  . Not on file     Family History: The patient's family history includes Colon cancer in his brother. ROS:   Please see the history of present illness.    All other systems reviewed and are negative.  EKGs/Labs/Other Studies Reviewed:    The following studies were reviewed today:  EKG:  EKG ordered today.  The ekg ordered today demonstrates rate controlled AF  Recent Labs:   03/04/18 Chol 176 HDL 39 LDL 132 CMP normal No results found for requested labs within last 8760 hours.  Recent Lipid Panel No results found for:  CHOL, TRIG, HDL, CHOLHDL, VLDL, LDLCALC, LDLDIRECT  Physical Exam:    VS:  BP 114/90 (BP Location: Right Arm, Patient Position: Sitting, Cuff Size: Normal)   Pulse 71   Ht 6\' 1"  (1.854 m)   Wt 206 lb 1.9 oz (93.5 kg)   SpO2 98%   BMI 27.19 kg/m     Wt Readings from Last 3 Encounters:  04/21/18 206 lb 1.9 oz (93.5 kg)  10/13/17 202 lb 12.8 oz (92 kg)  09/09/17 204 lb (92.5 kg)     GEN:  Well nourished, well developed in no acute distress HEENT: Normal NECK: No JVD; No carotid bruits LYMPHATICS: No lymphadenopathy CARDIAC: Irr Irr variablr s1 RRR,  RESPIRATORY:  Clear to auscultation without rales, wheezing or rhonchi  ABDOMEN:  Soft, non-tender, non-distended MUSCULOSKELETAL:  No edema; No deformity  SKIN: Warm and dry NEUROLOGIC:  Alert and oriented x 3 PSYCHIATRIC:  Normal affect    Signed, Norman Herrlich, MD  04/21/2018 4:50 PM    [facility]

## 2018-04-21 ENCOUNTER — Encounter: Payer: Self-pay | Admitting: Cardiology

## 2018-04-21 ENCOUNTER — Ambulatory Visit (INDEPENDENT_AMBULATORY_CARE_PROVIDER_SITE_OTHER): Payer: Medicare Other | Admitting: Cardiology

## 2018-04-21 VITALS — BP 114/90 | HR 71 | Ht 73.0 in | Wt 206.1 lb

## 2018-04-21 DIAGNOSIS — I25119 Atherosclerotic heart disease of native coronary artery with unspecified angina pectoris: Secondary | ICD-10-CM

## 2018-04-21 DIAGNOSIS — I482 Chronic atrial fibrillation, unspecified: Secondary | ICD-10-CM

## 2018-04-21 DIAGNOSIS — E785 Hyperlipidemia, unspecified: Secondary | ICD-10-CM

## 2018-04-21 DIAGNOSIS — I119 Hypertensive heart disease without heart failure: Secondary | ICD-10-CM

## 2018-04-21 DIAGNOSIS — Z7901 Long term (current) use of anticoagulants: Secondary | ICD-10-CM | POA: Diagnosis not present

## 2018-04-21 NOTE — Patient Instructions (Signed)
Medication Instructions:  Your physician recommends that you continue on your current medications as directed. Please refer to the Current Medication list given to you today.  If you need a refill on your cardiac medications before your next appointment, please call your pharmacy.   Lab work: NONE If you have labs (blood work) drawn today and your tests are completely normal, you will receive your results only by: . MyChart Message (if you have MyChart) OR . A paper copy in the mail If you have any lab test that is abnormal or we need to change your treatment, we will call you to review the results.  Testing/Procedures: You had an EKG today  Follow-Up: At [facility], you and your health needs are our priority.  As part of our continuing mission to provide you with exceptional heart care, we have created designated Provider Care Teams.  These Care Teams include your primary Cardiologist (physician) and Advanced Practice Providers (APPs -  Physician Assistants and Nurse Practitioners) who all work together to provide you with the care you need, when you need it. You will need a follow up appointment in 6 months.  Please call our office 2 months in advance to schedule this appointment.    

## 2018-05-03 ENCOUNTER — Other Ambulatory Visit: Payer: Self-pay | Admitting: Emergency Medicine

## 2018-05-03 MED ORDER — METOPROLOL TARTRATE 50 MG PO TABS: ORAL_TABLET | ORAL | 1 refills | Status: DC

## 2018-11-01 ENCOUNTER — Other Ambulatory Visit: Payer: Self-pay | Admitting: Cardiology

## 2018-11-11 IMAGING — NM NM MYOCAR PERF WALL MOTION
8 series · 48 of 48 positions shown · non-contrast
Comparison: none

[Series 1: wbr_r-proj_st rest · 6.51mm/px · 6 of 64 frames shown (1 of 2)]
[frame 6/64]
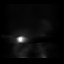
[frame 16/64]
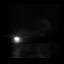
[frame 27/64]
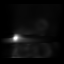
[frame 38/64]
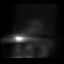
[frame 48/64]
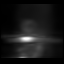
[frame 59/64]
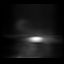

[Series 1: wbr_r-proj_st rest-mc · 6.51mm/px · 6 of 64 frames shown]
[frame 6/64]
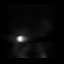
[frame 16/64]
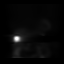
[frame 27/64]
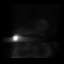
[frame 38/64]
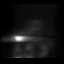
[frame 48/64]
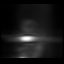
[frame 59/64]
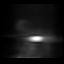

[Series 1: rest · 6.51mm/px · 6 of 64 frames shown]
[frame 6/64]
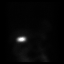
[frame 16/64]
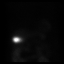
[frame 27/64]
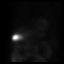
[frame 38/64]
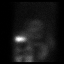
[frame 48/64]
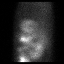
[frame 59/64]
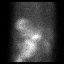

[Series 1: wbr_r-proj_st rest · 6.51mm/px · 6 of 64 frames shown (2 of 2)]
[frame 6/64]
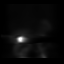
[frame 16/64]
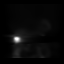
[frame 27/64]
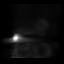
[frame 38/64]
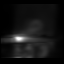
[frame 48/64]
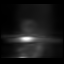
[frame 59/64]
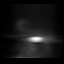

[Series 1: rest-mc · 6.51mm/px · 6 of 64 frames shown]
[frame 6/64]
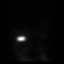
[frame 16/64]
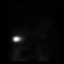
[frame 27/64]
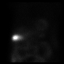
[frame 38/64]
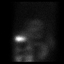
[frame 48/64]
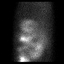
[frame 59/64]
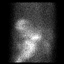

[Series 2: stress - gated · 6.51mm/px · 6 of 512 frames shown]
[frame 43/512]
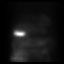
[frame 128/512]
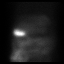
[frame 214/512]
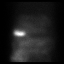
[frame 299/512]
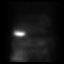
[frame 384/512]
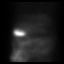
[frame 470/512]
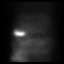

[Series 2: wbr_s-proj_st stress - gated · 6.51mm/px · 6 of 512 frames shown]
[frame 43/512]
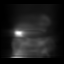
[frame 128/512]
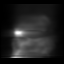
[frame 214/512]
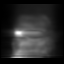
[frame 299/512]
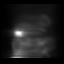
[frame 384/512]
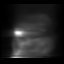
[frame 470/512]
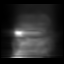

[Series 3: stress - perfusion · 6.51mm/px · 6 of 64 frames shown]
[frame 6/64]
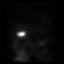
[frame 16/64]
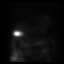
[frame 27/64]
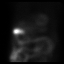
[frame 38/64]
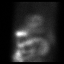
[frame 48/64]
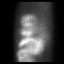
[frame 59/64]
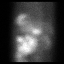

[48 of 48 positions shown; findings below may reference images not displayed]

Canned report from images found in remote index.

Refer to host system for actual result text.

## 2019-05-05 ENCOUNTER — Other Ambulatory Visit: Payer: Self-pay

## 2019-05-05 ENCOUNTER — Telehealth: Payer: Self-pay

## 2019-05-05 MED ORDER — METOPROLOL TARTRATE 50 MG PO TABS: ORAL_TABLET | ORAL | 1 refills | Status: DC

## 2019-05-05 MED ORDER — METOPROLOL TARTRATE 50 MG PO TABS
ORAL_TABLET | ORAL | 0 refills | Status: AC
Start: 2019-05-05 — End: ?

## 2019-05-05 NOTE — Telephone Encounter (Signed)
Metoprolol refill sent to [facility] in [place].

## 2019-05-05 NOTE — Telephone Encounter (Signed)
Metoprolol Tartrate refill sent to [facility] in [place].

## 2019-05-15 NOTE — Progress Notes (Signed)
Cardiology Office Note:    Date:  05/16/2019   ID:  Tristan Tate, DOB 01-May-1941, MRN [id number]  PCP:  Tristan Ivanoff., MD  Cardiologist:  Tristan Herrlich, MD    Referring MD: Tristan Tate*    ASSESSMENT:    1. Coronary artery disease involving native coronary artery of native heart with angina pectoris (HCC)   2. Chronic atrial fibrillation (HCC)   3. Long term current use of anticoagulant   4. Hypertensive heart disease without heart failure   5. Hyperlipidemia, unspecified hyperlipidemia type   6. Statin intolerance    PLAN:    In order of problems listed above:  1. Symptoms have progressed to become New York Heart Association class III no longer do activities like golf and with his history of previous bypass surgery and a myocardial perfusion study a year ago without demonstrable ischemia advised to undergo angiography and PCI if appropriate for flow-limiting stenosis.  We placed a urgent request for his cath CABG records from 2007.  For additional antianginal therapy he declined to continue his beta-blocker and in the past he was intolerant of ranolazine with dizziness.  If PCI is performed I think he be best served with clopidogrel plus a direct anticoagulant Eliquis as opposed to resuming warfarin. 2. Rate controlled continue beta-blocker anticoagulant 3. We will add a calcium channel blocker amlodipine 5 mg for hypertension also for additional effect antianginal 4. Poorly controlled has been intolerant hence has declined other oral therapy and if he has PCI and stent I will do my best to convince him to accept PCSK9 inhibitor.   Next appointment: 6 weeks   Medication Adjustments/Labs and Tests Ordered: Current medicines are reviewed at length with the patient today.  Concerns regarding medicines are outlined above.  No orders of the defined types were placed in this encounter.  No orders of the defined types were placed in this encounter.   Chief  Complaint  Patient presents with  . Follow-up  . Coronary Artery Disease    History of Present Illness:    Tristan Tate is a 78 y.o. 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  His myocardial perfusion study performed 09/09/2017 shows ejection fraction 54% fixed defect was present there was no ischemia.  Unfortunately do not have his original surgical operative notes from [facility] He was last seen 04/21/2018. Compliance with diet, lifestyle and medications: Yes  Results been difficult communicating with Tristan Tate is worse now during COVID-19.  We honestly had to had a note back and forth to each other writing questions and answers.  He is unhappy with the quality of his life if he tries to do activities more than walking out to the car he has limiting angina no longer can golf.  He also complains of exercise intolerance and fatigue.  He can nitroglycerin before and with angina that has not been effective.  I offered to put him on additional antianginal therapy he declines.  After period of question answers and discussion of benefits options and risk he elects to undergo coronary angiography.  We put an urgent request in for cath CABG records from [facility] 2007 will schedule after January 1.  He has no dye allergy most recent labs showed a GFR greater than 90 cc 03/04/2018.  He has no contraindication to combined antiplatelet anticoagulant  therapy.  If he has PCI and stent I would favor withdrawal of warfarin and transition to Eliquis plus clopidogrel.  Not having edema shortness of breath palpitation or syncope.  03/14/2018 cholesterol 176 LDL 132 HDL 39.     Past Medical History:  Diagnosis Date  . Back pain 02/27/2016  . Chronic gastric ulcer 08/28/2017  . Chronic pain of both shoulders 04/09/2016  . Coronary  artery disease involving native coronary artery of native heart with angina pectoris ([place]) 03/28/2015  . Esophageal reflux 08/28/2017  . Essential hypertension 03/28/2015  . Hydrocele, right 04/07/2017  . Hyperlipemia 03/28/2015   Statin intolerant Statin intolerant  . Impaired fasting glucose 08/28/2017  . Long term current use of anticoagulant 03/28/2015  . Nephrolithiasis 08/28/2017  . Olecranon bursitis of right elbow 04/10/2017  . PAF (paroxysmal atrial fibrillation) ([place]) 03/28/2015   CHADS2 vasc score= 3  . Pneumonia 02/27/2016  . Rheumatoid arthritis ([place]) 08/28/2017  . Right leg pain 04/07/2017  . Secondary cardiomyopathy ([place]) 08/28/2017  . 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 Stroke ([place]) 05/08/2015  . Syncope 02/27/2016    Past Surgical History:  Procedure Laterality Date  . CORONARY ANGIOPLASTY WITH STENT PLACEMENT    . CORONARY ARTERY BYPASS GRAFT  01/2006   LTA to LAD svg's to D3, M3, Ramus and RCA   . TOTAL HIP REVISION      Current Medications: Current Meds  Medication Sig  . acetaminophen (TYLENOL 8 HOUR ARTHRITIS PAIN) 650 MG CR tablet Take 650 mg by mouth every 8 (eight) hours as needed for pain.  Marland Kitchen DOCOSAHEXAENOIC ACID PO Take 1 g by mouth 2 (two) times daily.  . metoprolol tartrate (LOPRESSOR) 50 MG tablet TAKE 1/2 TABLET BY MOUTH TWICE DAILY  . nitroGLYCERIN (NITROSTAT) 0.4 MG SL tablet Place 1 tablet (0.4 mg total) under the tongue every 5 (five) minutes as needed for chest pain.  . vitamin B-12 (CYANOCOBALAMIN) 1000 MCG tablet Take 1,000 mcg by mouth daily.  Marland Kitchen warfarin (COUMADIN) 4 MG tablet TAKE 1 TABLET BY MOUTH DAILY OR AS DIRECTED BASED ON INR     Allergies:   Aspirin and Nsaids   Social History   Socioeconomic History  . Marital status: Unknown    Spouse name: Not on file  . Number of children: Not on file  . Years of education: Not on file  . Highest education level: Not on file  Occupational History  . Not on file  Tobacco Use  . Smoking  status: Former Smoker    Quit date: 03/29/1975    Years since quitting: 44.1  . Smokeless tobacco: Never Used  Substance and Sexual Activity  . Alcohol use: Never  . Drug use: Never  . Sexual activity: Not on file  Other Topics Concern  . Not on file  Social History Narrative  . Not on file   Social Determinants of Health   Financial Resource Strain:   . Difficulty of Paying Living Expenses: Not on file  Food Insecurity:   . Worried About Charity fundraiser in the Last Year: Not on file  . Ran Out of Food in the Last Year: Not on file  Transportation Needs:   . Lack of Transportation (Medical): Not on file  . Lack of Transportation (Non-Medical): Not on file  Physical Activity:   . Days of Exercise per Week: Not on file  . Minutes of Exercise per Session: Not on file  Stress:   . Feeling of Stress : Not on  file  Social Connections:   . Frequency of Communication with Friends and Family: Not on file  . Frequency of Social Gatherings with Friends and Family: Not on file  . Attends Religious Services: Not on file  . Active Member of Clubs or Organizations: Not on file  . Attends Banker Meetings: Not on file  . Marital Status: Not on file     Family History: The patient's family history includes Colon cancer in his brother. ROS:   Please see the history of present illness.    All other systems reviewed and are negative.  EKGs/Labs/Other Studies Reviewed:    The following studies were reviewed today:  EKG:  EKG ordered today and personally reviewed.  The ekg ordered today demonstrates atrial fibrillation controlled ventricular rate nonspecific ST abnormality   Physical Exam:    VS:  BP (!) 160/90   Pulse 67   Ht 6\' 2"  (1.88 m)   Wt 196 lb (88.9 kg)   BMI 25.16 kg/m     Wt Readings from Last 3 Encounters:  05/16/19 196 lb (88.9 kg)  04/21/18 206 lb 1.9 oz (93.5 kg)  10/13/17 202 lb 12.8 oz (92 kg)     GEN:  Well nourished, well developed in no  acute distress HEENT: Normal NECK: No JVD; No carotid bruits LYMPHATICS: No lymphadenopathy CARDIAC: Negative the rhythm variable first heart sound RRR, no murmurs, rubs, gallops RESPIRATORY:  Clear to auscultation without rales, wheezing or rhonchi  ABDOMEN: Soft, non-tender, non-distended MUSCULOSKELETAL:  No edema; No deformity  SKIN: Warm and dry NEUROLOGIC:  Alert and oriented x 3 PSYCHIATRIC:  Normal affect    Signed, Tristan Herrlich, MD  05/16/2019 3:55 PM    [facility]

## 2019-05-16 ENCOUNTER — Ambulatory Visit (INDEPENDENT_AMBULATORY_CARE_PROVIDER_SITE_OTHER): Payer: Medicare Other | Admitting: Cardiology

## 2019-05-16 ENCOUNTER — Ambulatory Visit (HOSPITAL_BASED_OUTPATIENT_CLINIC_OR_DEPARTMENT_OTHER)
Admission: RE | Admit: 2019-05-16 | Discharge: 2019-05-16 | Disposition: A | Discharge status: Home / Self Care | Payer: Medicare Other | Source: Ambulatory Visit | Attending: Cardiology | Admitting: Cardiology

## 2019-05-16 ENCOUNTER — Other Ambulatory Visit: Payer: Self-pay

## 2019-05-16 VITALS — BP 160/90 | HR 67 | Ht 74.0 in | Wt 196.0 lb

## 2019-05-16 DIAGNOSIS — I25119 Atherosclerotic heart disease of native coronary artery with unspecified angina pectoris: Secondary | ICD-10-CM

## 2019-05-16 DIAGNOSIS — I482 Chronic atrial fibrillation, unspecified: Secondary | ICD-10-CM | POA: Diagnosis not present

## 2019-05-16 DIAGNOSIS — Z01818 Encounter for other preprocedural examination: Secondary | ICD-10-CM | POA: Insufficient documentation

## 2019-05-16 DIAGNOSIS — I119 Hypertensive heart disease without heart failure: Secondary | ICD-10-CM | POA: Diagnosis not present

## 2019-05-16 DIAGNOSIS — Z7901 Long term (current) use of anticoagulants: Secondary | ICD-10-CM | POA: Diagnosis not present

## 2019-05-16 DIAGNOSIS — Z789 Other specified health status: Secondary | ICD-10-CM

## 2019-05-16 DIAGNOSIS — E785 Hyperlipidemia, unspecified: Secondary | ICD-10-CM

## 2019-05-16 MED ORDER — AMLODIPINE BESYLATE 5 MG PO TABS: 5.0000 mg | ORAL_TABLET | Freq: Every day | ORAL | 3 refills | Status: AC

## 2019-05-16 NOTE — Patient Instructions (Addendum)
Medication Instructions:  Your physician has recommended you make the following change in your medication:  START amlodipine (norvasc) 5 mg: Take 1 tablet daily  *If you need a refill on your cardiac medications before your next appointment, please call your pharmacy*  Lab Work: Your physician recommends that you return for lab work today: CBC, BMP.   If you have labs (blood work) drawn today and your tests are completely normal, you will receive your results only by: Marland Kitchen MyChart Message (if you have MyChart) OR . A paper copy in the mail If you have any lab test that is abnormal or we need to change your treatment, we will call you to review the results.  Testing/Procedures: You had an EKG today.   You will go for pre-procedural drive-thru COVID testing on Saturday, 06/03/2018, at 11:50 am. Please go to [address] [place], [place] and arrive 15 minutes early. Go to the building overhang. Do NOT go to the tent or the tent line. Self-isolate afterwards until after your heart catheterization.   A chest x-ray takes a picture of the organs and structures inside the chest, including the heart, lungs, and blood vessels. This test can show several things, including, whether the heart is enlarges; whether fluid is building up in the lungs; and whether pacemaker / defibrillator leads are still in place. Go downstairs on the 1st floor to [facility] [address] [facility] for your chest x-ray before leaving the building.      [facility] CARDIOVASCULAR DIVISION [facility] [place] [address] [place] [address] Dept: [phone] Loc: [phone]  Tristan Tate  05/16/2019  You are scheduled for a Cardiac Catheterization on Tuesday, January 5 with Dr. Bryan Lemma.  1. Please arrive at the [facility] (Main Entrance A) at [facility]: [address] [place], [place] [address] at 7:00 AM (This time is two hours before your procedure  to ensure your preparation). Free valet parking service is available.   Special note: Every effort is made to have your procedure done on time. Please understand that emergencies sometimes delay scheduled procedures.  2. Diet: Do not eat solid foods after midnight.  The patient may have clear liquids until 5am upon the day of the procedure.  3. Labs: None needed.   4. Medication instructions in preparation for your procedure:   Contrast Allergy: No   Stop taking Coumadin (Warfarin) on Friday, January 1.   On the morning of your procedure, take any morning medicines NOT listed above.  You may use sips of water.  5. Plan for one night stay--bring personal belongings. 6. Bring a current list of your medications and current insurance cards. 7. You MUST have a responsible person to drive you home. 8. Someone MUST be with you the first 24 hours after you arrive home or your discharge will be delayed. 9. Please wear clothes that are easy to get on and off and wear slip-on shoes.  Thank you for allowing [place] to care for you!   -- [facility]  Follow-Up: At [facility], you and your health needs are our priority.  As part of our continuing mission to provide you with exceptional heart care, we have created designated Provider Care Teams.  These Care Teams include your primary Cardiologist (physician) and Advanced Practice Providers (APPs -  Physician Assistants and Nurse Practitioners) who all work together to provide you with the care you need, when  you need it.  Your next appointment:   6 week(s)  The format for your next appointment:   In Person  Provider:   Norman Herrlich, MD   Amlodipine tablets What is this medicine? AMLODIPINE (am LOE di peen) is a calcium-channel blocker. It affects the amount of calcium found in your heart and muscle cells. This relaxes your blood vessels, which can reduce the amount of work the heart has to do. This medicine  is used to lower high blood pressure. It is also used to prevent chest pain. This medicine may be used for other purposes; ask your health care provider or pharmacist if you have questions. COMMON BRAND NAME(S): Norvasc What should I tell my health care provider before I take this medicine? They need to know if you have any of these conditions:  heart disease  liver disease  an unusual or allergic reaction to amlodipine, other medicines, foods, dyes, or preservatives  pregnant or trying to get pregnant  breast-feeding How should I use this medicine? Take this medicine by mouth with a glass of water. Follow the directions on the prescription label. You can take it with or without food. If it upsets your stomach, take it with food. Take your medicine at regular intervals. Do not take it more often than directed. Do not stop taking except on your doctor's advice. Talk to your pediatrician regarding the use of this medicine in children. While this drug may be prescribed for children as young as 6 years for selected conditions, precautions do apply. Patients over 15 years of age may have a stronger reaction and need a smaller dose. Overdosage: If you think you have taken too much of this medicine contact a poison control center or emergency room at once. NOTE: This medicine is only for you. Do not share this medicine with others. What if I miss a dose? If you miss a dose, take it as soon as you can. If it is almost time for your next dose, take only that dose. Do not take double or extra doses. What may interact with this medicine? Do not take this medicine with any of the following medications:  tranylcypromine This medicine may also interact with the following medications:  clarithromycin  cyclosporine  diltiazem  itraconazole  simvastatin  tacrolimus This list may not describe all possible interactions. Give your health care provider a list of all the medicines, herbs,  non-prescription drugs, or dietary supplements you use. Also tell them if you smoke, drink alcohol, or use illegal drugs. Some items may interact with your medicine. What should I watch for while using this medicine? Visit your healthcare professional for regular checks on your progress. Check your blood pressure as directed. Ask your healthcare professional what your blood pressure should be and when you should contact him or her. Do not treat yourself for coughs, colds, or pain while you are using this medicine without asking your healthcare professional for advice. Some medicines may increase your blood pressure. You may get dizzy. Do not drive, use machinery, or do anything that needs mental alertness until you know how this medicine affects you. Do not stand or sit up quickly, especially if you are an older patient. This reduces the risk of dizzy or fainting spells. Avoid alcoholic drinks; they can make you dizzier. What side effects may I notice from receiving this medicine? Side effects that you should report to your doctor or health care professional as soon as possible:  allergic reactions like skin  rash, itching or hives; swelling of the face, lips, or tongue  fast, irregular heartbeat  signs and symptoms of low blood pressure like dizziness; feeling faint or lightheaded, falls; unusually weak or tired  swelling of ankles, feet, hands Side effects that usually do not require medical attention (report these to your doctor or health care professional if they continue or are bothersome):  dry mouth  facial flushing  headache  stomach pain  tiredness This list may not describe all possible side effects. Call your doctor for medical advice about side effects. You may report side effects to FDA at 1-800-FDA-1088. Where should I keep my medicine? Keep out of the reach of children. Store at room temperature between 59 and 86 degrees F (15 and 30 degrees C). Throw away any unused  medicine after the expiration date. NOTE: This sheet is a summary. It may not cover all possible information. If you have questions about this medicine, talk to your doctor, pharmacist, or health care provider.  2020 [employer]/Gold Standard (2017-12-11 15:07:10)

## 2019-05-17 LAB — BASIC METABOLIC PANEL
BUN/Creatinine Ratio: 19 (ref 10–24)
BUN: 20 mg/dL (ref 8–27)
CO2: 23 mmol/L (ref 20–29)
Calcium: 9.3 mg/dL (ref 8.6–10.2)
Chloride: 105 mmol/L (ref 96–106)
Creatinine, Ser: 1.03 mg/dL (ref 0.76–1.27)
GFR calc Af Amer: 80 mL/min/{1.73_m2} (ref >59)
GFR calc non Af Amer: 69 mL/min/{1.73_m2} (ref >59)
Glucose: 76 mg/dL (ref 65–99)
Potassium: 4.5 mmol/L (ref 3.5–5.2)
Sodium: 142 mmol/L (ref 134–144)

## 2019-05-17 LAB — CBC
Hematocrit: 46.2 % (ref 37.5–51.0)
Hemoglobin: 15.1 g/dL (ref 13.0–17.7)
MCH: 29.4 pg (ref 26.6–33.0)
MCHC: 32.7 g/dL (ref 31.5–35.7)
MCV: 90 fL (ref 79–97)
Platelets: 161 10*3/uL (ref 150–450)
RBC: 5.13 x10E6/uL (ref 4.14–5.80)
RDW: 14.1 % (ref 11.6–15.4)
WBC: 8.2 10*3/uL (ref 3.4–10.8)

## 2019-06-03 DEATH — deceased

## 2019-06-04 ENCOUNTER — Inpatient Hospital Stay (HOSPITAL_COMMUNITY): Admission: RE | Admit: 2019-06-04 | Payer: Medicare Other | Source: Ambulatory Visit

## 2019-06-06 ENCOUNTER — Telehealth: Payer: Self-pay | Admitting: *Deleted

## 2019-06-06 NOTE — Telephone Encounter (Signed)
I called patient to ask if he had pre procedure Covid-19 test done 06/04/19 prior to LHC/grafts scheduled for 06/07/19. I spoke with patient's wife, Tristan Tate ([place]). Pt's wife states patient passed away last week. Pt's wife states patient was playing golf last week, slumped over while driving golf cart, was taken to [facility], she does not know much more detail.  Pt's wife asked me to notify Dr Dulce Sellar, advised her I would forward this information to Dr Dulce Sellar.  I have cancelled LHC/grafts scheduled for 06/07/19.

## 2019-06-07 ENCOUNTER — Ambulatory Visit (HOSPITAL_COMMUNITY): Admission: RE | Admit: 2019-06-07 | Payer: Medicare Other | Source: Home / Self Care | Admitting: Cardiology

## 2019-06-07 SURGERY — LEFT HEART CATH AND CORS/GRAFTS ANGIOGRAPHY
Anesthesia: LOCAL

## 2019-07-11 ENCOUNTER — Ambulatory Visit: Payer: Medicare Other | Admitting: Cardiology

## 2020-07-17 IMAGING — DX DG CHEST 2V
2 series · 2 of 2 positions shown · non-contrast
Comparison: 06/26/2015

CLINICAL DATA: Preop

EXAM:
CHEST - 2 VIEW

[chest pa]
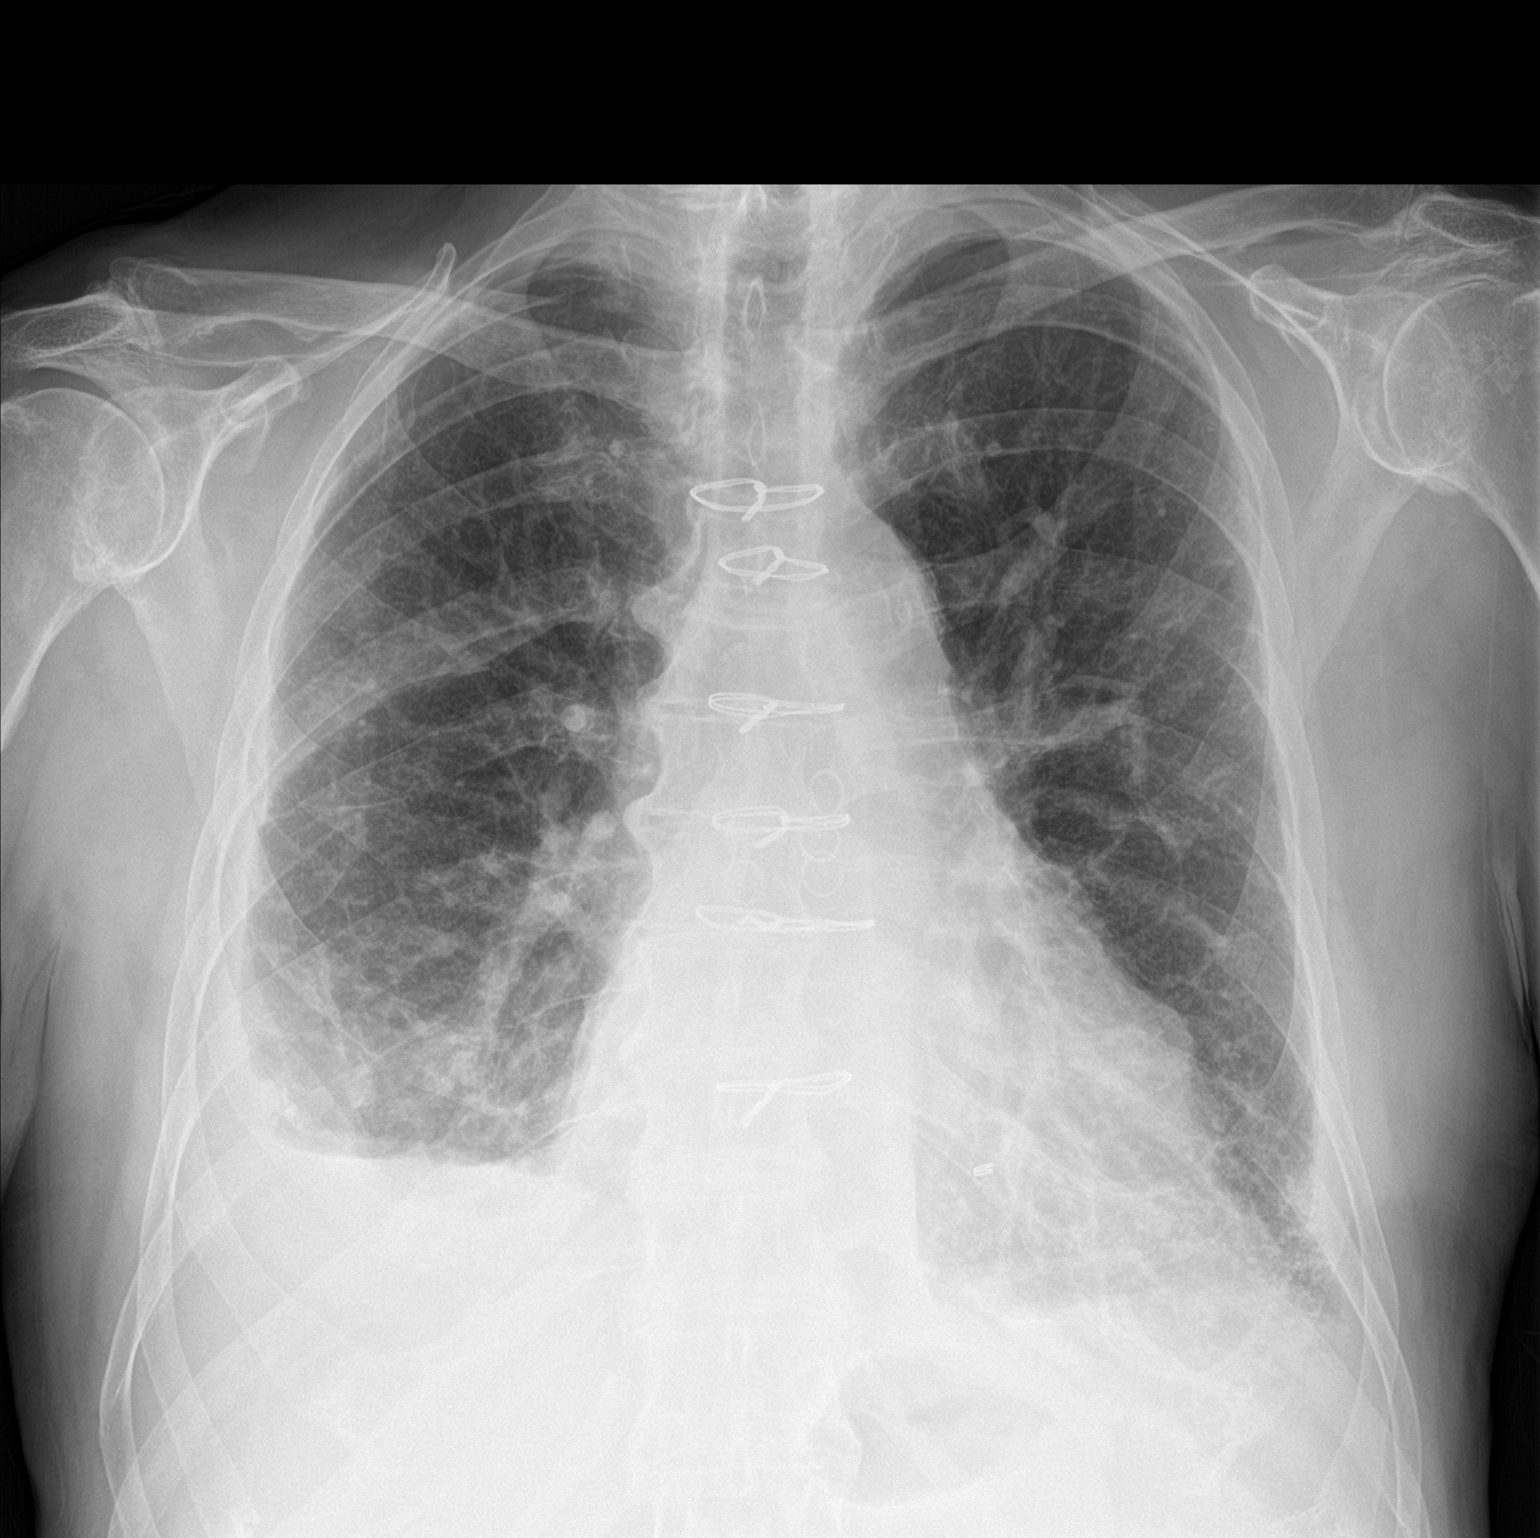

[chest lat]
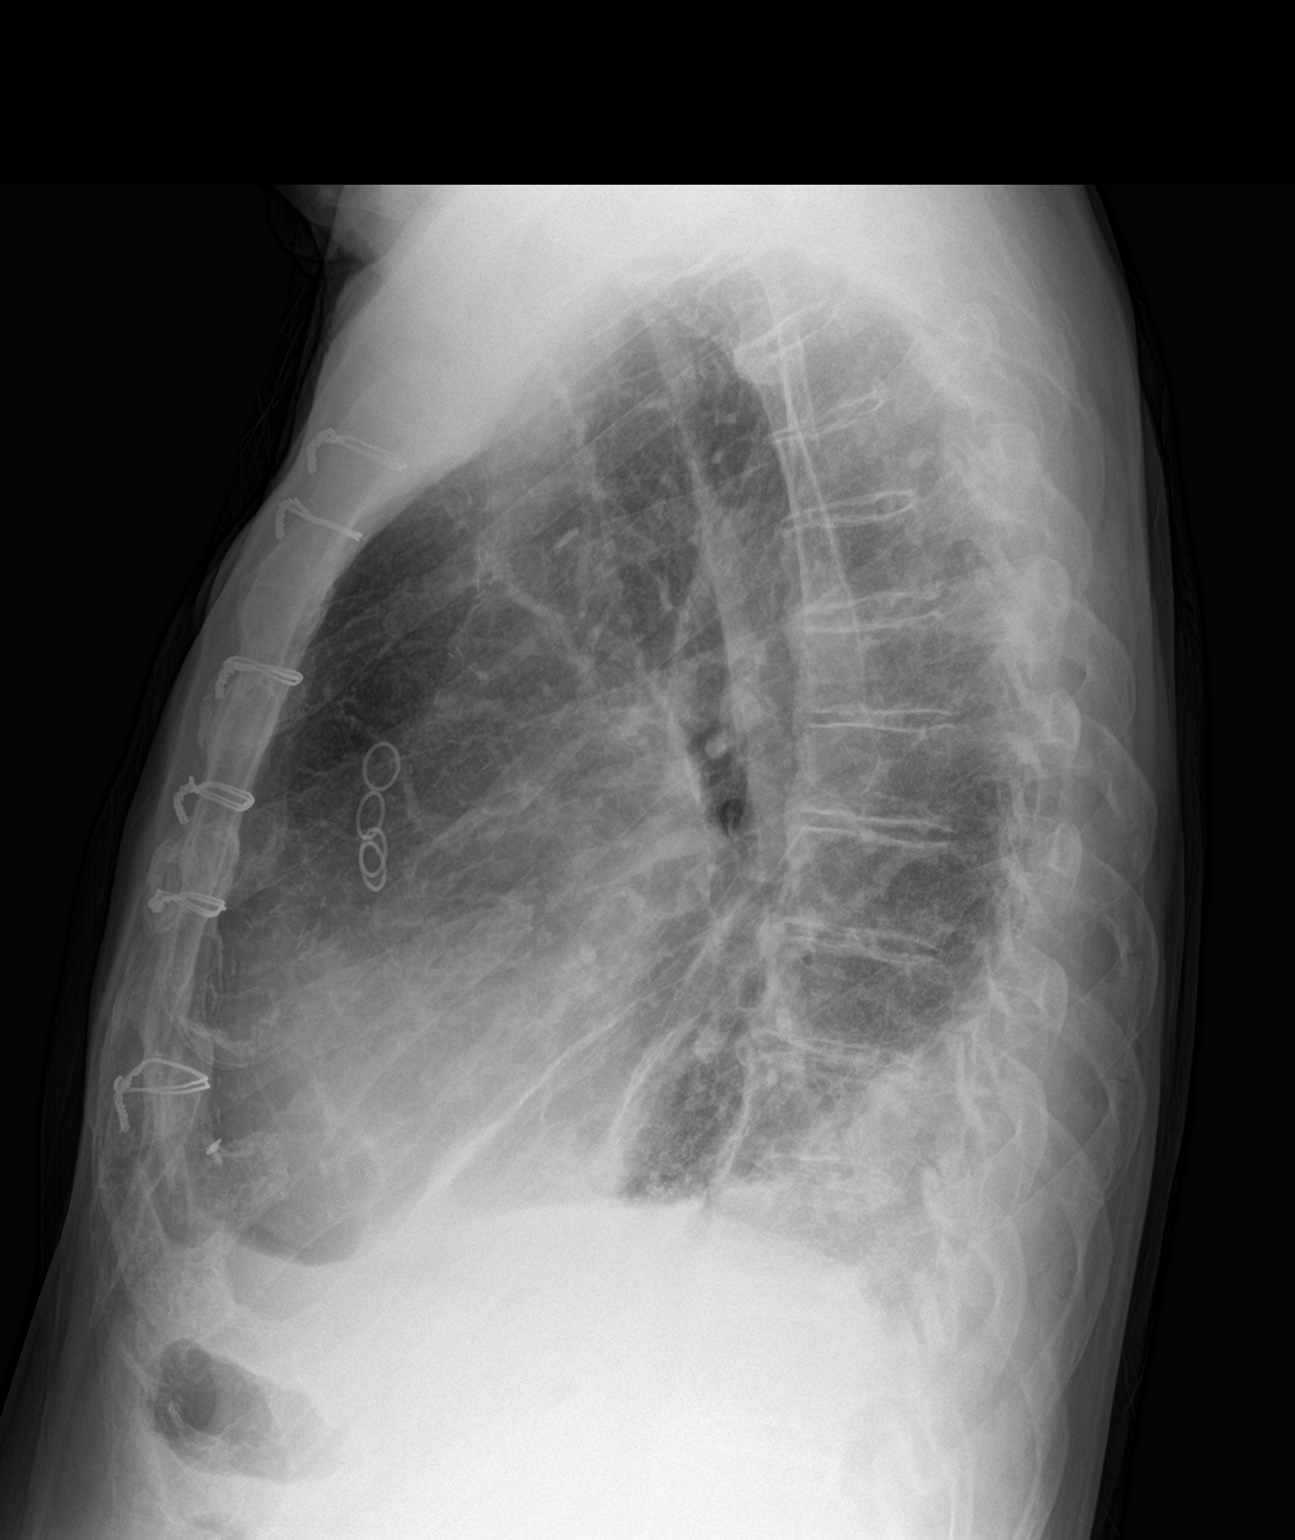

[2 of 2 positions shown; findings below may reference images not displayed]

FINDINGS: Small right pleural effusion.  No pneumothorax.

Subpleural reticulation/fibrosis at the lung bases, suggesting mild
chronic interstitial lung disease, new from 3990.

The heart is top-normal in size. Postsurgical changes related to
prior CABG. Median sternotomy.
IMPRESSION: Small right pleural effusion.

Suspected mild chronic interstitial lung disease, new from 3990.
